# Patient Record
Sex: Male | Born: 1990 | Race: White | Hispanic: No | Marital: Single | Smoking: Never smoker
Health system: Southern US, Community
[De-identification: ages and names within clinical notes are randomized; demographics above are authoritative.]

---
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
## 2010-06-24 ENCOUNTER — Ambulatory Visit: Payer: Self-pay | Admitting: Family Medicine

## 2010-07-10 ENCOUNTER — Encounter: Payer: Self-pay | Admitting: Family Medicine

## 2011-06-15 ENCOUNTER — Ambulatory Visit: Payer: Self-pay | Admitting: Family Medicine

## 2011-06-23 ENCOUNTER — Ambulatory Visit: Payer: Self-pay | Admitting: Family Medicine

## 2011-08-02 ENCOUNTER — Ambulatory Visit: Payer: Self-pay | Admitting: Family Medicine

## 2012-01-08 ENCOUNTER — Ambulatory Visit: Payer: Self-pay | Admitting: Family Medicine

## 2012-01-15 ENCOUNTER — Ambulatory Visit: Payer: Self-pay | Admitting: Family Medicine

## 2012-03-11 ENCOUNTER — Ambulatory Visit: Payer: Self-pay | Admitting: Family Medicine

## 2012-03-12 ENCOUNTER — Ambulatory Visit: Payer: Self-pay

## 2012-03-14 ENCOUNTER — Ambulatory Visit: Payer: Self-pay

## 2012-03-16 ENCOUNTER — Ambulatory Visit: Payer: Self-pay | Admitting: Family Medicine

## 2012-06-28 ENCOUNTER — Ambulatory Visit: Payer: Self-pay | Admitting: Family Medicine

## 2012-10-21 ENCOUNTER — Ambulatory Visit: Payer: Self-pay | Admitting: Family Medicine

## 2013-02-01 ENCOUNTER — Ambulatory Visit: Payer: Self-pay | Admitting: Family Medicine

## 2013-05-26 ENCOUNTER — Ambulatory Visit: Payer: Self-pay | Admitting: Family Medicine

## 2013-05-27 ENCOUNTER — Ambulatory Visit: Payer: Self-pay | Admitting: Family Medicine

## 2013-08-09 IMAGING — US US PELVIS LIMITED
1 series · 17 of 25 positions shown · non-contrast
Comparison: none

REASON FOR EXAM: CR [id number]  left pain denies injury
COMMENTS:

[Series 1: us pelvis limited · 17 of 81 slices shown]
[im 1/81]
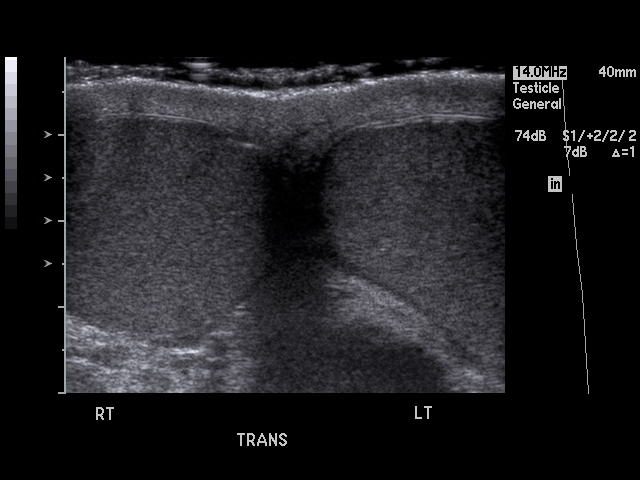
[im 7/81]
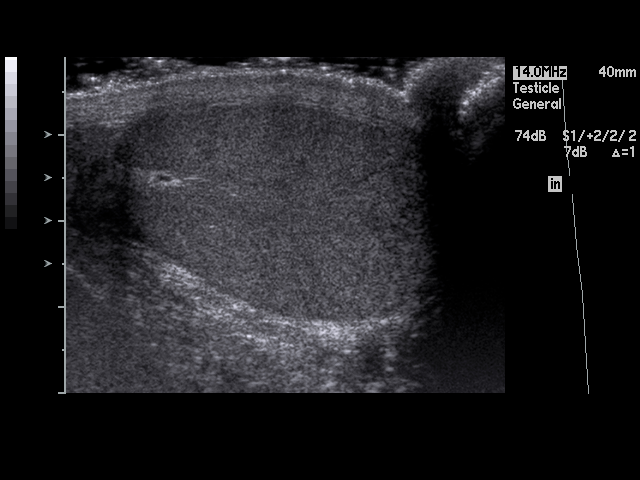
[im 11/81]
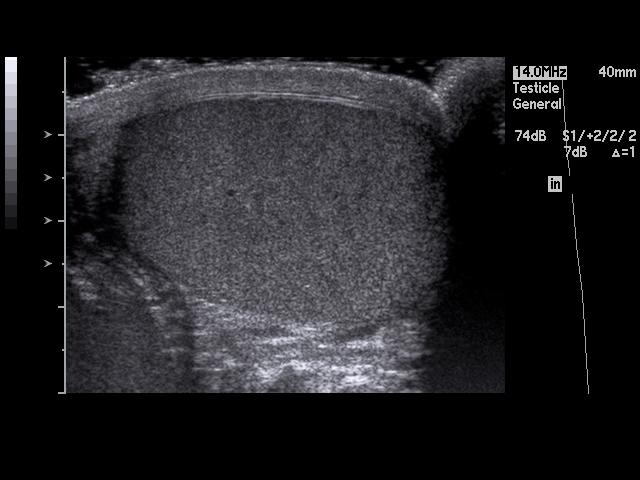
[im 17/81]
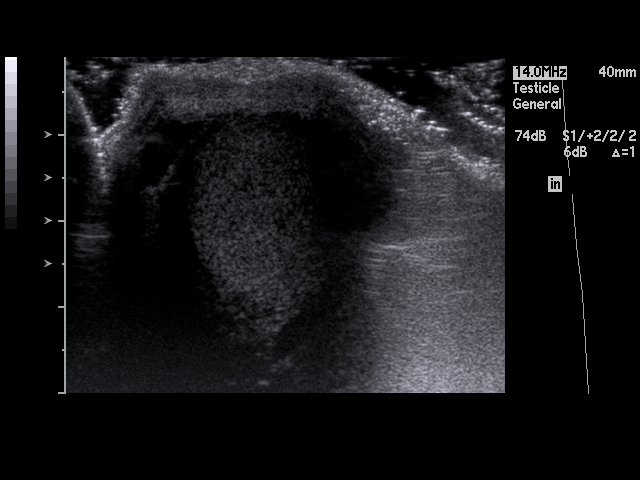
[im 21/81]
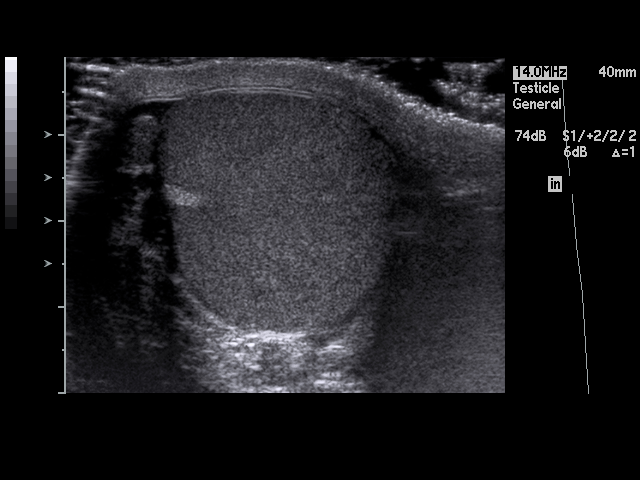
[im 27/81]
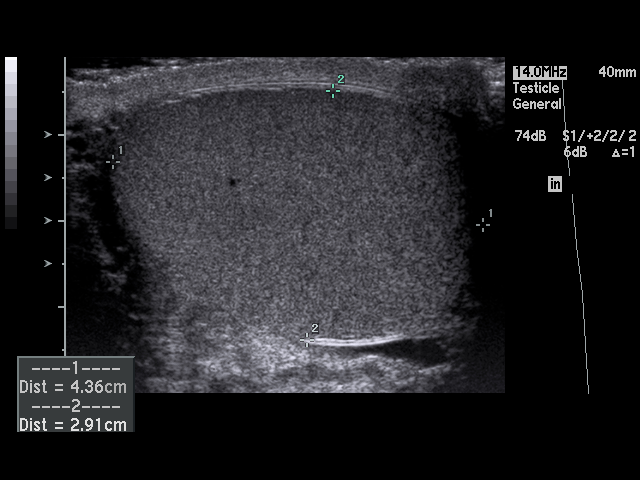
[im 31/81]
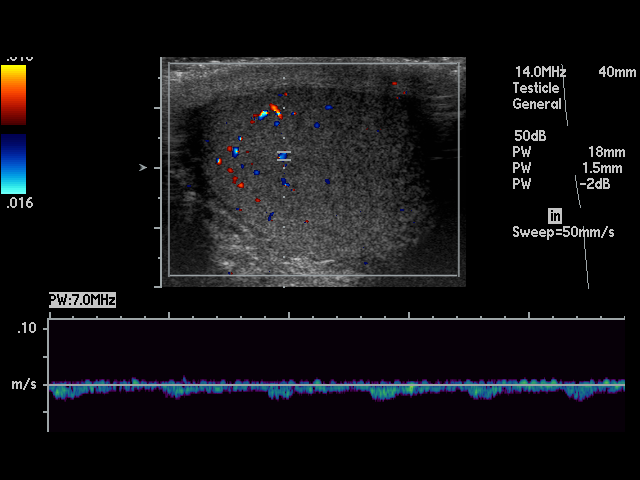
[im 37/81]
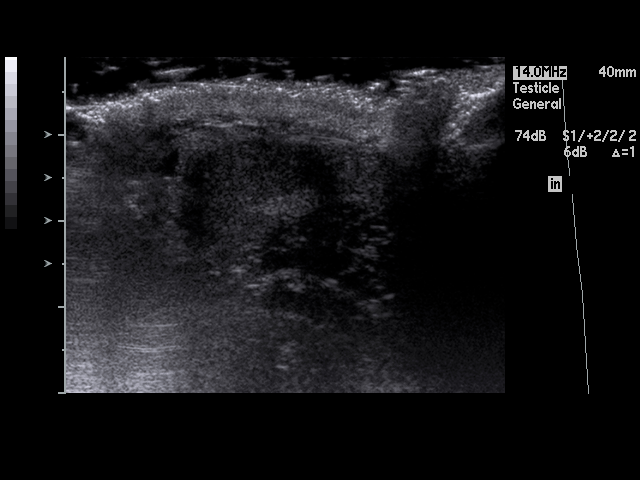
[im 41/81]
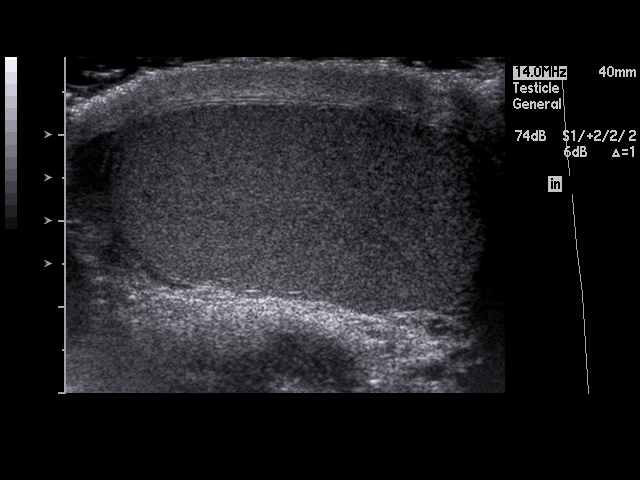
[im 44/81]
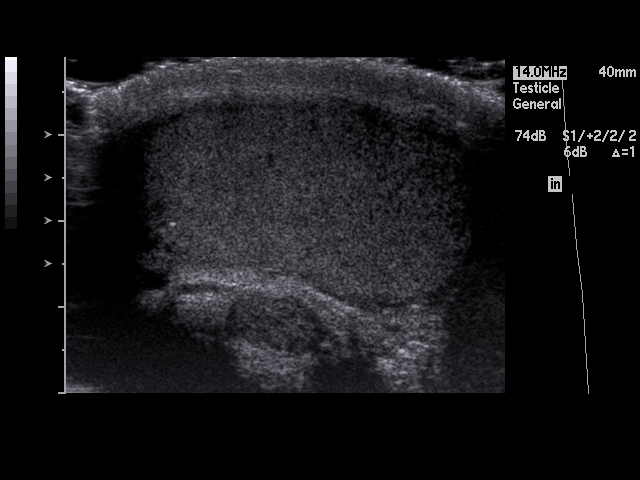
[im 51/81]
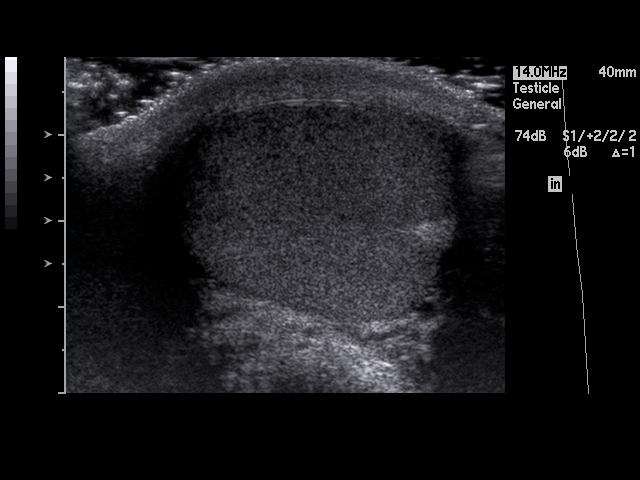
[im 54/81]
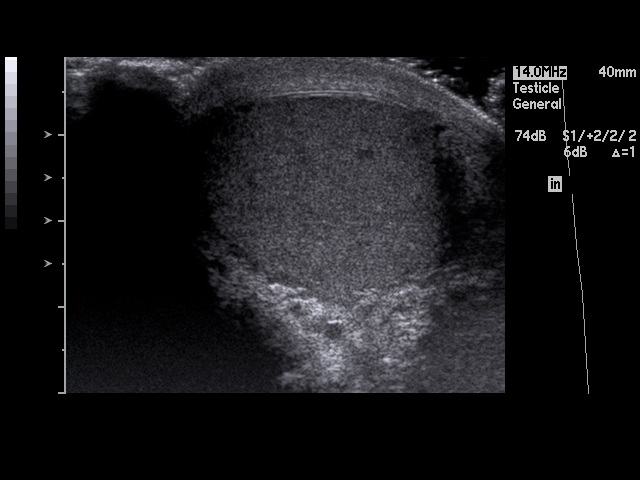
[im 61/81]
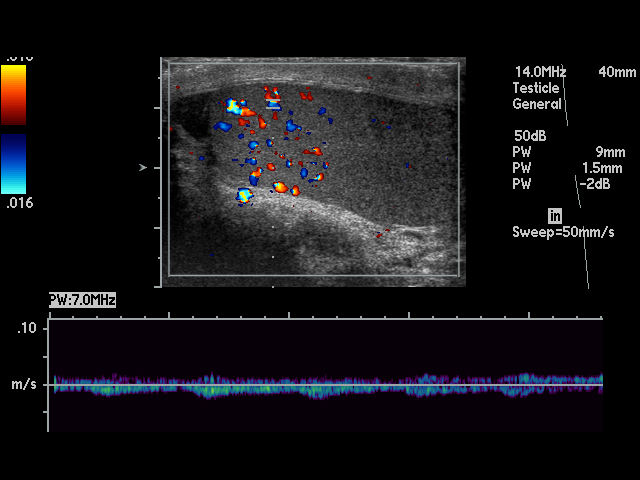
[im 64/81]
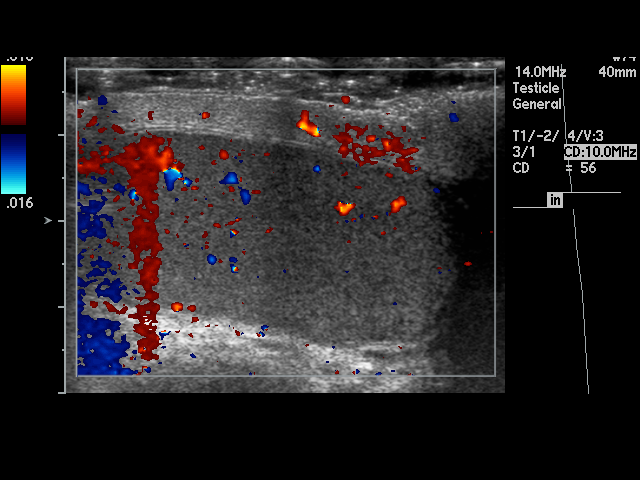
[im 71/81]
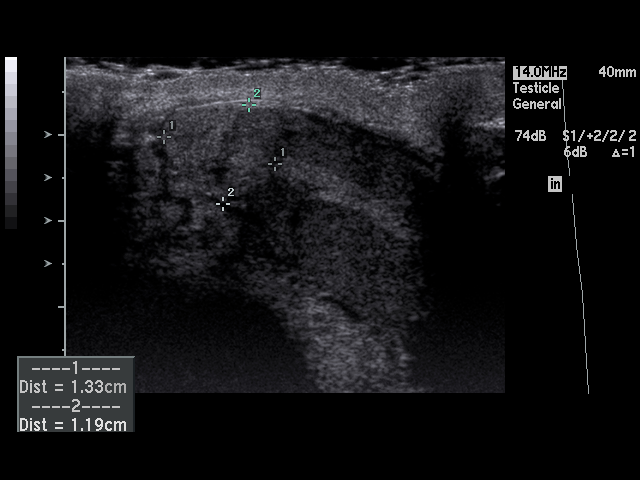
[im 74/81]
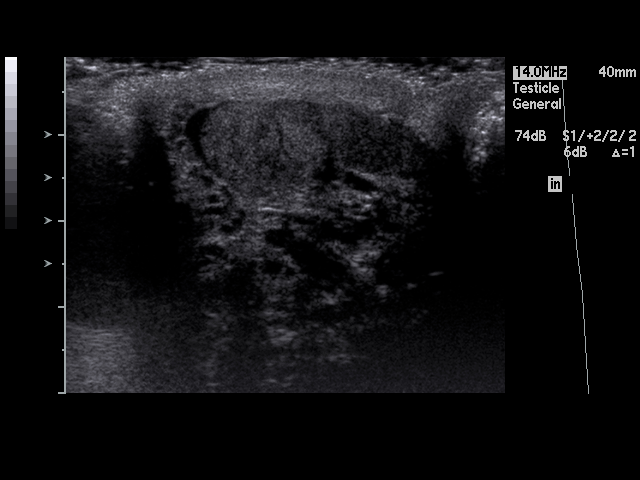
[im 81/81]
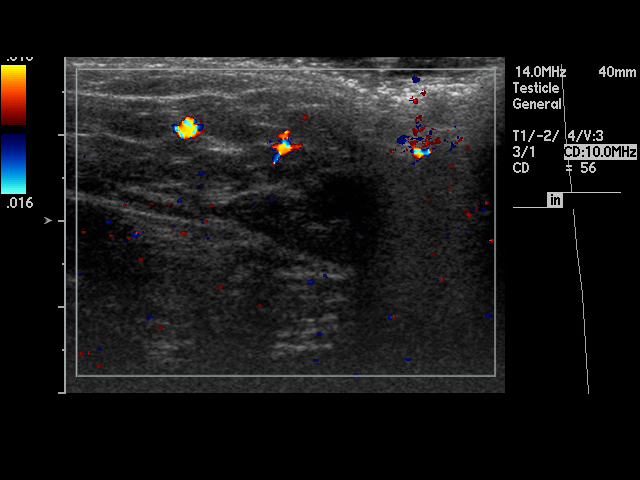

[17 of 25 positions shown; findings below may reference images not displayed]

PROCEDURE:     US  - US TESTICULAR  - January 08, 2012  [DATE]

RESULT:     Testicular sonogram is performed emergently. Blood flow to the
testicles is documented with color and spectral Doppler flow bilaterally
including arterial and venous flow with spectral Doppler interrogation. The
right testicle shows a homogeneous appearance measuring 4.36 x 2.91 x
cm. The left testicle measures 4.65 x 2.13 x 2.97 cm. The left epididymis is
somewhat heterogeneous in appearance measuring 1.33 x 1.99 cm. No abnormal
fluid collection is evident. There is no hydrocele or varicocele
demonstrated.
IMPRESSION: 1. No evidence of testicular torsion or mass.
2. Heterogeneous appearance of the left epididymis which could reflect early
changes of epididymitis. Frank hyperemia is not readily apparent. No
hydrocele is demonstrated.

## 2014-01-28 IMAGING — CR DG SHOULDER 3+V*L*
1 series · 7 of 7 positions shown · non-contrast
Comparison: 06/15/2011

REASON FOR EXAM: left shoulder injury hx of surgery
COMMENTS:

PROCEDURE:     KDR - KDXR SHOULDER LEFT COMPLETE  - June 28, 2012  [DATE]
RESULT:     History: Injury

[Series 1: internal rotate · 0.17mm/px · 7 of 7 slices shown]
[im 1/7]
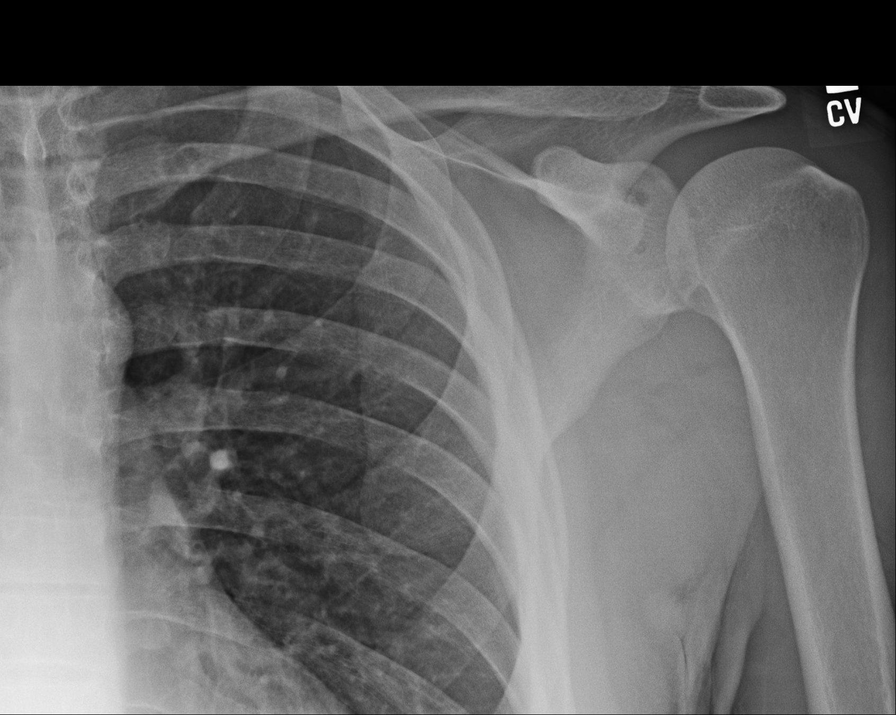
[im 2/7]
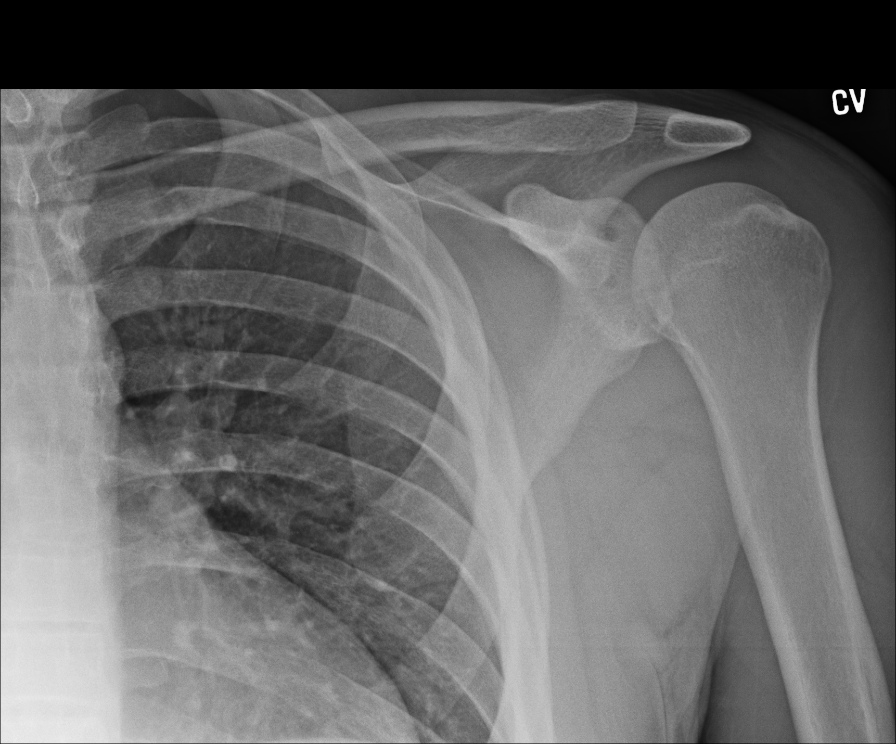
[im 3/7]
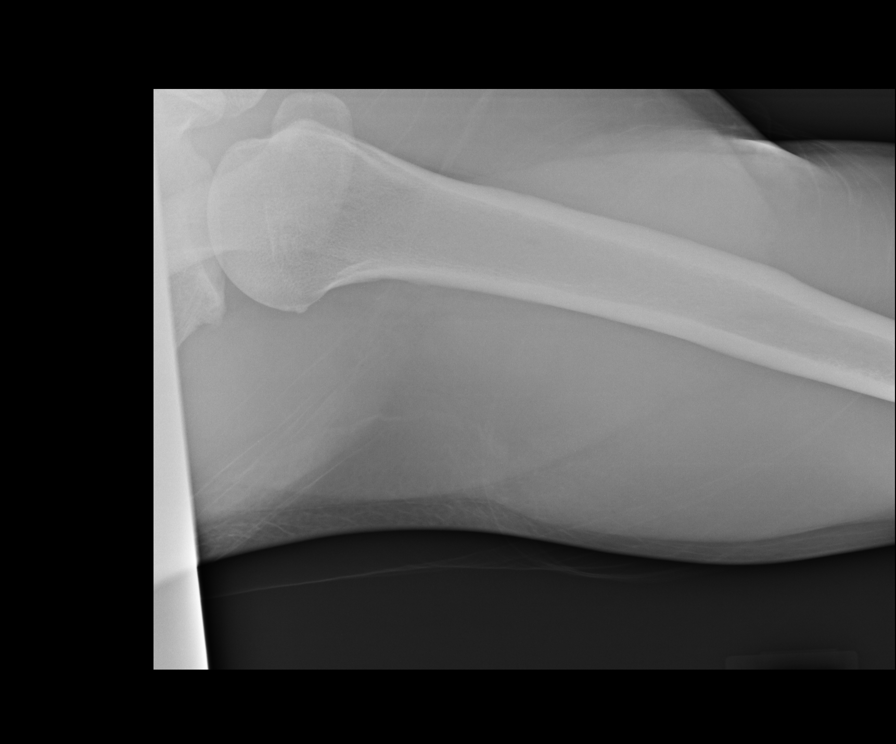
[im 4/7]
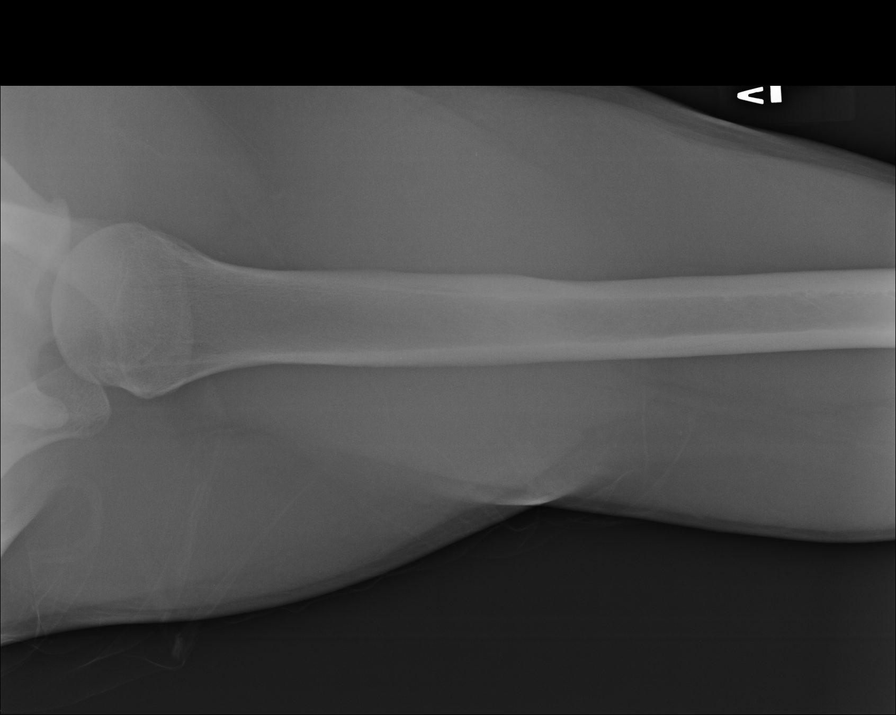
[im 5/7]
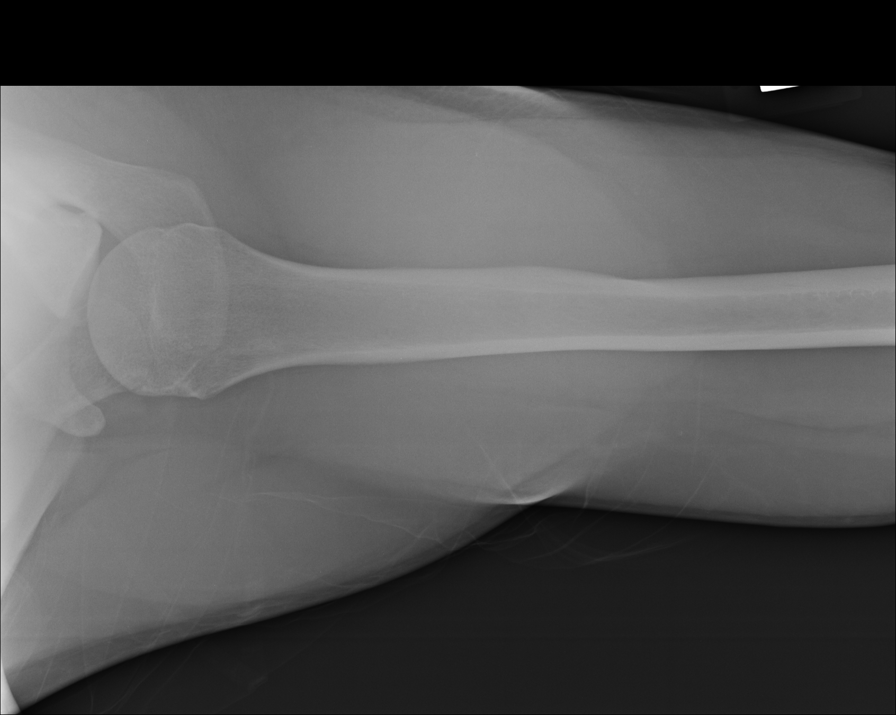
[im 6/7]
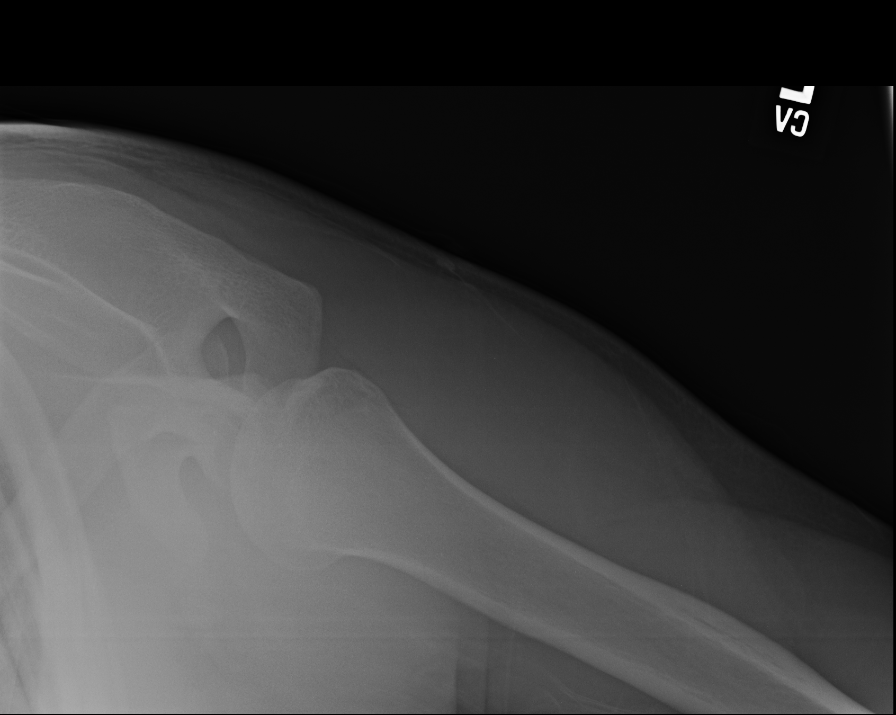
[im 7/7]
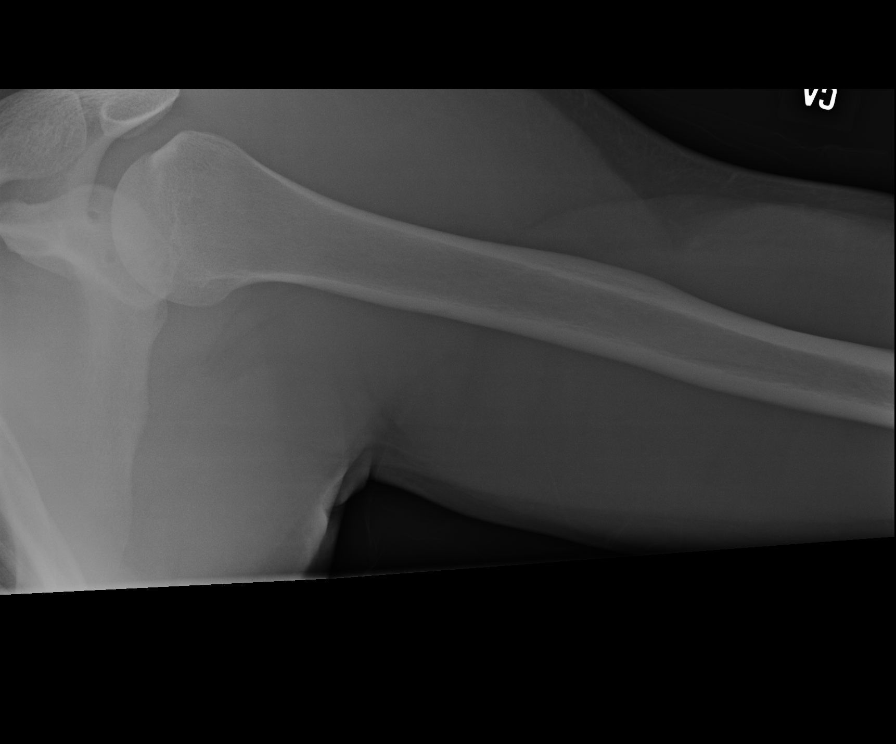

[7 of 7 positions shown; findings below may reference images not displayed]

FINDINGS: 3 views of the left shoulder demonstrates no fracture or dislocation. There
postsurgical changes in the glenoid. There is mild irregularity along the
posterior glenoid likely secondary to prior injury. There is no displaced
fracture fragment. If there is clinical concern regarding a labral injury
further evaluation with MR Arthrogram of the left shoulder is recommended.
The acromioclavicular joint is normal.
IMPRESSION: Please see above.

[REDACTED]

## 2014-06-07 ENCOUNTER — Encounter: Payer: Self-pay | Admitting: Physician Assistant

## 2014-06-07 ENCOUNTER — Ambulatory Visit (INDEPENDENT_AMBULATORY_CARE_PROVIDER_SITE_OTHER): Payer: 59 | Admitting: Physician Assistant

## 2014-06-07 VITALS — BP 100/68 | HR 60 | Temp 98.1°F | Resp 18 | Ht 74.0 in | Wt 225.0 lb

## 2014-06-07 DIAGNOSIS — F909 Attention-deficit hyperactivity disorder, unspecified type: Secondary | ICD-10-CM

## 2014-06-07 MED ORDER — LISDEXAMFETAMINE DIMESYLATE 20 MG PO CAPS: 20.0000 mg | ORAL_CAPSULE | Freq: Every day | ORAL | Status: DC

## 2014-06-07 NOTE — Patient Instructions (Addendum)
Vyvanse 20 mg, Once daily to help with your ADHD symptoms.  If emergency symptoms discussed during visit developed, seek medical attention immediately.  Followup in 3 months to reassess, or for worsening or persistent symptoms despite treatment.     Attention Deficit Hyperactivity Disorder Attention deficit hyperactivity disorder (ADHD) is a problem with behavior issues based on the way the brain functions (neurobehavioral disorder). It is a common reason for behavior and academic problems in school. SYMPTOMS  There are 3 types of ADHD. The 3 types and some of the symptoms include:  Inattentive.  Gets bored or distracted easily.  Loses or forgets things. Forgets to hand in homework.  Has trouble organizing or completing tasks.  Difficulty staying on task.  An inability to organize daily tasks and school work.  Leaving projects, chores, or homework unfinished.  Trouble paying attention or responding to details. Careless mistakes.  Difficulty following directions. Often seems like is not listening.  Dislikes activities that require sustained attention (like chores or homework).  Hyperactive-impulsive.  Feels like it is impossible to sit still or stay in a seat. Fidgeting with hands and feet.  Trouble waiting turn.  Talking too much or out of turn. Interruptive.  Speaks or acts impulsively.  Aggressive, disruptive behavior.  Constantly busy or on the go; noisy.  Often leaves seat when they are expected to remain seated.  Often runs or climbs where it is not appropriate, or feels very restless.  Combined.  Has symptoms of both of the above. Often children with ADHD feel discouraged about themselves and with school. They often perform well below their abilities in school. As children get older, the excess motor activities can calm down, but the problems with paying attention and staying organized persist. Most children do not outgrow ADHD but with good treatment can  learn to cope with the symptoms. DIAGNOSIS  When ADHD is suspected, the diagnosis should be made by professionals trained in ADHD. This professional will collect information about the individual suspected of having ADHD. Information must be collected from various settings where the person lives, works, or attends school.  Diagnosis will include:  Confirming symptoms began in childhood.  Ruling out other reasons for the child's behavior.  The health care providers will check with the child's school and check their medical records.  They will talk to teachers and parents.  Behavior rating scales for the child will be filled out by those dealing with the child on a daily basis. A diagnosis is made only after all information has been considered. TREATMENT  Treatment usually includes behavioral treatment, tutoring or extra support in school, and stimulant medicines. Because of the way a person's brain works with ADHD, these medicines decrease impulsivity and hyperactivity and increase attention. This is different than how they would work in a person who does not have ADHD. Other medicines used include antidepressants and certain blood pressure medicines. Most experts agree that treatment for ADHD should address all aspects of the person's functioning. Along with medicines, treatment should include structured classroom management at school. Parents should reward good behavior, provide constant discipline, and set limits. Tutoring should be available for the child as needed. ADHD is a lifelong condition. If untreated, the disorder can have long-term serious effects into adolescence and adulthood. HOME CARE INSTRUCTIONS   Often with ADHD there is a lot of frustration among family members dealing with the condition. Blame and anger are also feelings that are common. In many cases, because the problem affects the family  as a whole, the entire family may need help. A therapist can help the family find  better ways to handle the disruptive behaviors of the person with ADHD and promote change. If the person with ADHD is young, most of the therapist's work is with the parents. Parents will learn techniques for coping with and improving their child's behavior. Sometimes only the child with the ADHD needs counseling. Your health care providers can help you make these decisions.  Children with ADHD may need help learning how to organize. Some helpful tips include:  Keep routines the same every day from wake-up time to bedtime. Schedule all activities, including homework and playtime. Keep the schedule in a place where the person with ADHD will often see it. Mark schedule changes as far in advance as possible.  Schedule outdoor and indoor recreation.  Have a place for everything and keep everything in its place. This includes clothing, backpacks, and school supplies.  Encourage writing down assignments and bringing home needed books. Work with your child's teachers for assistance in organizing school work.  Offer your child a well-balanced diet. Breakfast that includes a balance of whole grains, protein, and fruits or vegetables is especially important for school performance. Children should avoid drinks with caffeine including:  Soft drinks.  Coffee.  Tea.  However, some older children (adolescents) may find these drinks helpful in improving their attention. Because it can also be common for adolescents with ADHD to become addicted to caffeine, talk with your health care provider about what is a safe amount of caffeine intake for your child.  Children with ADHD need consistent rules that they can understand and follow. If rules are followed, give small rewards. Children with ADHD often receive, and expect, criticism. Look for good behavior and praise it. Set realistic goals. Give clear instructions. Look for activities that can foster success and self-esteem. Make time for pleasant activities with  your child. Give lots of affection.  Parents are their children's greatest advocates. Learn as much as possible about ADHD. This helps you become a stronger and better advocate for your child. It also helps you educate your child's teachers and instructors if they feel inadequate in these areas. Parent support groups are often helpful. A national group with local chapters is called Children and Adults with Attention Deficit Hyperactivity Disorder (CHADD). SEEK MEDICAL CARE IF:  Your child has repeated muscle twitches, cough, or speech outbursts.  Your child has sleep problems.  Your child has a marked loss of appetite.  Your child develops depression.  Your child has new or worsening behavioral problems.  Your child develops dizziness.  Your child has a racing heart.  Your child has stomach pains.  Your child develops headaches. SEEK IMMEDIATE MEDICAL CARE IF:  Your child has been diagnosed with depression or anxiety and the symptoms seem to be getting worse.  Your child has been depressed and suddenly appears to have increased energy or motivation.  You are worried that your child is having a bad reaction to a medication he or she is taking for ADHD. Document Released: 09/12/2002 Document Revised: 09/27/2013 Document Reviewed: 05/30/2013 [facility] Patient Information 2015 [place], [place]. This information is not intended to replace advice given to you by your health care provider. Make sure you discuss any questions you have with your health care provider.

## 2014-06-07 NOTE — Progress Notes (Signed)
Subjective:    Patient ID: Roger Houston, male    DOB: 02-13-1991, 23 y.o.   MRN: [id number]  HPI Patient presents to clinic today to establish care. 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 Had Annual exams done through urgent cares. Most recent annual physical was in 05/2014.   Acute Concerns: ADD- Pt states that he used to be treated with Vyvanse while he was in school. He is now about to enter the work force, and is worried he wont do well because of his difficulty concentrating. Pt also states that he had some hyperactivity. Pt's prior dose of Vyvanse was  of regular release once daily. He states that he tolerated this well and denies any adverse effects to treatment.   Chronic Issues: None  Health Maintenance: Dental -- Dr. Rushie Chestnut, sees every 6 months. Vision -- Contacts, seen yearly for eye exam. [employer]. Immunizations -- UTD Colonoscopy -- No family history of colon cancer.   Review of Systems Patient denies fevers, chills, nausea, vomiting, diarrhea, chest pain, shortness of breath, orthopnea, headache, syncope. Denies lower extremity edema, abdominal pain, change in appetite, change in bowel movements. Patient denies rashes, musculoskeletal complaints. No other specific complaints in a complete review of systems.    History reviewed. No pertinent past medical history.  History   Social History  . Marital Status: Single    Spouse Name: N/A    Number of Children: N/A  . Years of Education: N/A   Occupational History  . Not on file.   Social History Main Topics  . Smoking status: Never Smoker   . Smokeless tobacco: Not on file  . Alcohol Use: Yes     Comment: socially; once or twice a month   . Drug Use: No  . Sexual Activity: Not on file   Other Topics Concern  . Not on file   Social History Narrative  . No narrative on file    History reviewed. No pertinent past surgical history.  History reviewed. No pertinent family  history.  No Known Allergies  No current outpatient prescriptions on file prior to visit.   No current facility-administered medications on file prior to visit.     The PFS history was reviewed with the pt at the time of visit.  EXAM: BP 100/68  Pulse 60  Temp(Src) 98.1 F (36.7 C) (Oral)  Resp 18  Ht  (1.88 m)  Wt 225 lb (102.059 kg)  BMI 28.88 kg/m2      Objective:   Physical Exam  Nursing note and vitals reviewed. Constitutional: He is oriented to person, place, and time. He appears well-developed and well-nourished. No distress.  HENT:  Head: Normocephalic and atraumatic.  Right Ear: External ear normal.  Left Ear: External ear normal.  Nose: Nose normal.  Mouth/Throat: Oropharynx is clear and moist. No oropharyngeal exudate.  bilat TMs normal.  Eyes: Conjunctivae and EOM are normal. Pupils are equal, round, and reactive to light.  Neck: Normal range of motion. Neck supple.  Cardiovascular: Normal rate, regular rhythm, normal heart sounds and intact distal pulses.   Pulmonary/Chest: Effort normal and breath sounds normal. No stridor. No respiratory distress. He has no wheezes. He has no rales. He exhibits no tenderness.  Musculoskeletal: Normal range of motion. He exhibits no edema.  Lymphadenopathy:    He has no cervical adenopathy.  Neurological: He is alert and oriented to person, place, and time. He exhibits normal muscle tone.  Skin:  Skin is warm and dry. He is not diaphoretic.  Psychiatric: He has a normal mood and affect. His behavior is normal. Judgment and thought content normal.     No results found for this basename: WBC, HGB, HCT, PLT, GLUCOSE, CHOL, TRIG, HDL, LDLDIRECT, LDLCALC, ALT, AST, NA, K, CL, CREATININE, BUN, CO2, TSH, PSA, INR, GLUF, HGBA1C, MICROALBUR        Assessment & Plan:  Jahquan was seen today for establish care and add.  Diagnoses and associated orders for this visit:  Attention deficit hyperactivity disorder (ADHD),  unspecified ADHD type Comments: Has been on Vyvanse in the past, and olerated this well. Will re-start  once daily. - lisdexamfetamine (VYVANSE) 20 MG capsule; Take 1 capsule (20 mg total) by mouth daily. - lisdexamfetamine (VYVANSE) 20 MG capsule; Take 1 capsule (20 mg total) by mouth daily. - lisdexamfetamine (VYVANSE) 20 MG capsule; Take 1 capsule (20 mg total) by mouth daily.    Will have pt sign release to have medical records sent from other facilities.  Return precautions provided, and patient handout on ADHD.  Plan to follow up in about 3 months to reassess, or for worsening or persistent symptoms despite treatment.  Patient Instructions  Vyvanse 20 mg, Once daily to help with your ADHD symptoms.  If emergency symptoms discussed during visit developed, seek medical attention immediately.  Followup in 3 months to reassess, or for worsening or persistent symptoms despite treatment.

## 2014-09-04 ENCOUNTER — Ambulatory Visit: Payer: 59 | Admitting: Internal Medicine

## 2014-09-07 ENCOUNTER — Telehealth: Payer: Self-pay | Admitting: Physician Assistant

## 2014-09-07 NOTE — Telephone Encounter (Signed)
Pt mom said dr fry agreed to accept her son as new pt. Pt was sch to see dr Kirtland Bouchardk on 09-04-14 and no showed. Can I sch with dr Clent Ridgesfry

## 2014-09-08 ENCOUNTER — Ambulatory Visit (INDEPENDENT_AMBULATORY_CARE_PROVIDER_SITE_OTHER): Payer: 59 | Admitting: Family Medicine

## 2014-09-08 ENCOUNTER — Encounter: Payer: Self-pay | Admitting: Family Medicine

## 2014-09-08 VITALS — BP 110/80 | Temp 97.8°F | Wt 224.0 lb

## 2014-09-08 DIAGNOSIS — F900 Attention-deficit hyperactivity disorder, predominantly inattentive type: Secondary | ICD-10-CM

## 2014-09-08 MED ORDER — LISDEXAMFETAMINE DIMESYLATE 30 MG PO CAPS: 30.0000 mg | ORAL_CAPSULE | Freq: Every day | ORAL | Status: DC

## 2014-09-08 NOTE — Telephone Encounter (Signed)
Pt has been sch

## 2014-09-08 NOTE — Telephone Encounter (Signed)
Yes I can see him  ?

## 2014-09-08 NOTE — Progress Notes (Signed)
   Subjective:    Patient ID: Roger Houston, male    DOB: [birth year], 23 y.o.   MRN: [id number]  HPI Here to establish with us and to discuss his meds. He was diagnosed with ADHD in college and took Vyvanse for a semester with good results. Now he is living in [place] 6 months for an on the job training program, and he has found that he needs help focusing on his work. He works for an [employer], [employer] and this involves a lot of reading and sitting through meetings. He has ben on Vyvanse 20 mg daily for the past few months and it has helped, but he wants to increase the dose a little. He has no side effects.    Review of Systems  Constitutional: Negative.   Respiratory: Negative.   Cardiovascular: Negative.   Neurological: Negative.   Psychiatric/Behavioral: Positive for decreased concentration. Negative for hallucinations, behavioral problems, confusion, dysphoric mood and agitation. The patient is not nervous/anxious and is not hyperactive.        Objective:   Physical Exam  Constitutional: He is oriented to person, place, and time. He appears well-developed and well-nourished.  Cardiovascular: Normal rate, regular rhythm, normal heart sounds and intact distal pulses.   Pulmonary/Chest: Effort normal and breath sounds normal.  Neurological: He is alert and oriented to person, place, and time.  Psychiatric: He has a normal mood and affect. His behavior is normal. Judgment and thought content normal.          Assessment & Plan:  We will increase the Vyvanse to 30 mg daily. Recheck in 90 days

## 2014-09-08 NOTE — Progress Notes (Signed)
Pre visit review using our clinic review tool, if applicable. No additional management support is needed unless otherwise documented below in the visit note. 

## 2015-03-29 ENCOUNTER — Telehealth: Payer: Self-pay | Admitting: Family Medicine

## 2015-03-29 MED ORDER — LISDEXAMFETAMINE DIMESYLATE 30 MG PO CAPS: 30.0000 mg | ORAL_CAPSULE | Freq: Every day | ORAL | Status: DC

## 2015-03-29 MED ORDER — LISDEXAMFETAMINE DIMESYLATE 30 MG PO CAPS: 30.0000 mg | ORAL_CAPSULE | Freq: Every day | ORAL | Status: AC

## 2015-03-29 NOTE — Telephone Encounter (Signed)
done
